# Patient Record
Sex: Female | Born: 1965 | Race: Asian | Hispanic: No | Marital: Married | Smoking: Never smoker
Health system: Southern US, Community
[De-identification: ages and names within clinical notes are randomized; demographics above are authoritative.]

---

## 2015-03-30 DIAGNOSIS — N816 Rectocele: Secondary | ICD-10-CM | POA: Insufficient documentation

## 2015-03-30 DIAGNOSIS — N814 Uterovaginal prolapse, unspecified: Secondary | ICD-10-CM | POA: Insufficient documentation

## 2015-03-30 DIAGNOSIS — D250 Submucous leiomyoma of uterus: Secondary | ICD-10-CM | POA: Insufficient documentation

## 2015-05-09 HISTORY — PX: ABDOMINAL HYSTERECTOMY: SHX81

## 2015-07-13 DIAGNOSIS — Z90710 Acquired absence of both cervix and uterus: Secondary | ICD-10-CM | POA: Insufficient documentation

## 2017-07-26 ENCOUNTER — Ambulatory Visit: Payer: Self-pay | Admitting: Family Medicine

## 2017-08-03 ENCOUNTER — Ambulatory Visit (INDEPENDENT_AMBULATORY_CARE_PROVIDER_SITE_OTHER): Payer: 59 | Admitting: Family Medicine

## 2017-08-03 ENCOUNTER — Encounter: Payer: Self-pay | Admitting: Family Medicine

## 2017-08-03 VITALS — BP 124/86 | HR 74 | Temp 97.7°F | Resp 14 | Ht 64.38 in | Wt 137.7 lb

## 2017-08-03 DIAGNOSIS — Z8041 Family history of malignant neoplasm of ovary: Secondary | ICD-10-CM

## 2017-08-03 DIAGNOSIS — Z1211 Encounter for screening for malignant neoplasm of colon: Secondary | ICD-10-CM

## 2017-08-03 DIAGNOSIS — Z8249 Family history of ischemic heart disease and other diseases of the circulatory system: Secondary | ICD-10-CM | POA: Diagnosis not present

## 2017-08-03 DIAGNOSIS — Z1231 Encounter for screening mammogram for malignant neoplasm of breast: Secondary | ICD-10-CM

## 2017-08-03 DIAGNOSIS — Z833 Family history of diabetes mellitus: Secondary | ICD-10-CM | POA: Diagnosis not present

## 2017-08-03 DIAGNOSIS — R635 Abnormal weight gain: Secondary | ICD-10-CM | POA: Diagnosis not present

## 2017-08-03 DIAGNOSIS — Z1239 Encounter for other screening for malignant neoplasm of breast: Secondary | ICD-10-CM

## 2017-08-03 NOTE — Progress Notes (Signed)
BP 124/86   Pulse 74   Temp 97.7 F (36.5 C) (Oral)   Resp 14   Ht 5' 4.38" (1.635 m)   Wt 137 lb 11.2 oz (62.5 kg)   SpO2 96%   BMI 23.36 kg/m    Subjective:    Patient ID: Christy Casey, female    DOB: [birth year], 52 y.o.   MRN: [id number]  HPI: Christy Casey is a 52 y.o. female  Chief Complaint  Patient presents with  . New Patient (Initial Visit)  . Establish Care    HPI Patient is here to establish care No issues with high blood pressure Hospitalized just for her hysterectomy; fibroids and bleeding and prolapse; no problems; ovaries remain, removed Fallopian tubes Mother died from ovarian cancer at age 65 years; she also had high blood pressure and depression Father has diabetes, now on insulin but pills at first; her sugar has been checked yearly with physicals; no dry mouth Brother had some coronary artery blockage and had a stent Cholesterol is normal too Good eater Sometimes exercises 10 pounds up over the last 3 years; no thyroid trouble  Depression screen PHQ 2/9 08/03/2017  Decreased Interest 0  Down, Depressed, Hopeless 0  PHQ - 2 Score 0    Relevant past medical, surgical, family and social history reviewed History reviewed. No pertinent past medical history. Past Surgical History:  Procedure Laterality Date  . ABDOMINAL HYSTERECTOMY  2017   Family History  Problem Relation Age of Onset  . Ovarian cancer Mother   . Hypertension Mother   . Depression Mother   . Diabetes Father   . CAD Brother   . Stroke Maternal Grandfather    Social History   Tobacco Use  . Smoking status: Never Smoker  . Smokeless tobacco: Never Used  Substance Use Topics  . Alcohol use: Not Currently  . Drug use: Not Currently    Interim medical history since last visit reviewed. Allergies and medications reviewed  Review of Systems  Constitutional: Negative for unexpected weight change.  Respiratory: Negative for shortness of breath.   Cardiovascular: Negative for  chest pain.  Hematological: Does not bruise/bleed easily.   Per HPI unless specifically indicated above     Objective:    BP 124/86   Pulse 74   Temp 97.7 F (36.5 C) (Oral)   Resp 14   Ht 5' 4.38" (1.635 m)   Wt 137 lb 11.2 oz (62.5 kg)   SpO2 96%   BMI 23.36 kg/m   Wt Readings from Last 3 Encounters:  08/03/17 137 lb 11.2 oz (62.5 kg)    Physical Exam  Constitutional: She appears well-developed and well-nourished.  HENT:  Mouth/Throat: Mucous membranes are normal.  Eyes: EOM are normal. No scleral icterus.  Cardiovascular: Normal rate and regular rhythm.  Pulmonary/Chest: Effort normal and breath sounds normal.  Psychiatric: She has a normal mood and affect. Her behavior is normal.    No results found for this or any previous visit.    Assessment & Plan:   Problem List Items Addressed This Visit    None    Visit Diagnoses    Weight gain    -  Primary   ten pounds over last 3 years, but nothing recent; willing to check TSH with physical labs when due, no signs of fluid overload; suspect caloric mismatch   Screen for colon cancer       Relevant Orders   Ambulatory referral to Gastroenterology   Family hx of  ovarian malignancy       Family history of coronary artery disease in brother       Family history of diabetes mellitus in father       Screening for breast cancer       Relevant Orders   MM SCREENING BREAST TOMO BILATERAL       Follow up plan: Return in about 1 year (around 08/04/2018) for complete physical (one year or sooner when due).  An after-visit summary was printed and given to the patient at [place].  Please see the patient instructions which may contain other information and recommendations beyond what is mentioned above in the assessment and plan.  No orders of the defined types were placed in this encounter.   Orders Placed This Encounter  Procedures  . MM SCREENING BREAST TOMO BILATERAL  . Ambulatory referral to Gastroenterology

## 2017-08-03 NOTE — Patient Instructions (Addendum)
Consider getting the new shingles vaccine called Shingrix; that is available for individuals 54 years of age and older, and is recommended even if you have had shingles in the past and/or already received the old shingles vaccine (Zostavax); it is a two-part series, and is available at many local pharmacies  Return for your yearly physical when due  Please do have your mammogram in December (on or after the 2nd), you have the number to call to schedule  DASH Eating Plan DASH stands for "Dietary Approaches to Stop Hypertension." The DASH eating plan is a healthy eating plan that has been shown to reduce high blood pressure (hypertension). It may also reduce your risk for type 2 diabetes, heart disease, and stroke. The DASH eating plan may also help with weight loss. What are tips for following this plan? General guidelines  Avoid eating more than 2,300 mg (milligrams) of salt (sodium) a day. If you have hypertension, you may need to reduce your sodium intake to 1,500 mg a day.  Limit alcohol intake to no more than 1 drink a day for nonpregnant women and 2 drinks a day for men. One drink equals 12 oz of beer, 5 oz of wine, or 1 oz of hard liquor.  Work with your health care provider to maintain a healthy body weight or to lose weight. Ask what an ideal weight is for you.  Get at least 30 minutes of exercise that causes your heart to beat faster (aerobic exercise) most days of the week. Activities may include walking, swimming, or biking.  Work with your health care provider or diet and nutrition specialist (dietitian) to adjust your eating plan to your individual calorie needs. Reading food labels  Check food labels for the amount of sodium per serving. Choose foods with less than 5 percent of the Daily Value of sodium. Generally, foods with less than 300 mg of sodium per serving fit into this eating plan.  To find whole grains, look for the word "whole" as the first word in the ingredient  list. Shopping  Buy products labeled as "low-sodium" or "no salt added."  Buy fresh foods. Avoid canned foods and premade or frozen meals. Cooking  Avoid adding salt when cooking. Use salt-free seasonings or herbs instead of table salt or sea salt. Check with your health care provider or pharmacist before using salt substitutes.  Do not fry foods. Cook foods using healthy methods such as baking, boiling, grilling, and broiling instead.  Cook with heart-healthy oils, such as olive, canola, soybean, or sunflower oil. Meal planning   Eat a balanced diet that includes: ? 5 or more servings of fruits and vegetables each day. At each meal, try to fill half of your plate with fruits and vegetables. ? Up to 6-8 servings of whole grains each day. ? Less than 6 oz of lean meat, poultry, or fish each day. A 3-oz serving of meat is about the same size as a deck of cards. One egg equals 1 oz. ? 2 servings of low-fat dairy each day. ? A serving of nuts, seeds, or beans 5 times each week. ? Heart-healthy fats. Healthy fats called Omega-3 fatty acids are found in foods such as flaxseeds and coldwater fish, like sardines, salmon, and mackerel.  Limit how much you eat of the following: ? Canned or prepackaged foods. ? Food that is high in trans fat, such as fried foods. ? Food that is high in saturated fat, such as fatty meat. ? Sweets, desserts,  sugary drinks, and other foods with added sugar. ? Full-fat dairy products.  Do not salt foods before eating.  Try to eat at least 2 vegetarian meals each week.  Eat more home-cooked food and less restaurant, buffet, and fast food.  When eating at a restaurant, ask that your food be prepared with less salt or no salt, if possible. What foods are recommended? The items listed may not be a complete list. Talk with your dietitian about what dietary choices are best for you. Grains Whole-grain or whole-wheat bread. Whole-grain or whole-wheat pasta. Brown  rice. [place]. Bulgur. Whole-grain and low-sodium cereals. Pita bread. Low-fat, low-sodium crackers. Whole-wheat flour tortillas. Vegetables Fresh or frozen vegetables (raw, steamed, roasted, or grilled). Low-sodium or reduced-sodium tomato and vegetable juice. Low-sodium or reduced-sodium tomato sauce and tomato paste. Low-sodium or reduced-sodium canned vegetables. Fruits All fresh, dried, or frozen fruit. Canned fruit in natural juice (without added sugar). Meat and other protein foods Skinless chicken or [place]. Ground chicken or [place]. Pork with fat trimmed off. Fish and seafood. Egg whites. Dried beans, peas, or lentils. Unsalted nuts, nut butters, and seeds. Unsalted canned beans. Lean cuts of beef with fat trimmed off. Low-sodium, lean deli meat. Dairy Low-fat (1%) or fat-free (skim) milk. Fat-free, low-fat, or reduced-fat cheeses. Nonfat, low-sodium ricotta or cottage cheese. Low-fat or nonfat yogurt. Low-fat, low-sodium cheese. Fats and oils Soft margarine without trans fats. Vegetable oil. Low-fat, reduced-fat, or light mayonnaise and salad dressings (reduced-sodium). Canola, safflower, olive, soybean, and sunflower oils. Avocado. Seasoning and other foods Herbs. Spices. Seasoning mixes without salt. Unsalted popcorn and pretzels. Fat-free sweets. What foods are not recommended? The items listed may not be a complete list. Talk with your dietitian about what dietary choices are best for you. Grains Baked goods made with fat, such as croissants, muffins, or some breads. Dry pasta or rice meal packs. Vegetables Creamed or fried vegetables. Vegetables in a cheese sauce. Regular canned vegetables (not low-sodium or reduced-sodium). Regular canned tomato sauce and paste (not low-sodium or reduced-sodium). Regular tomato and vegetable juice (not low-sodium or reduced-sodium). Angie Fava. Olives. Fruits Canned fruit in a light or heavy syrup. Fried fruit. Fruit in cream or butter  sauce. Meat and other protein foods Fatty cuts of meat. Ribs. Fried meat. Berniece Salines. Sausage. Bologna and other processed lunch meats. Salami. [place]back. Hotdogs. Bratwurst. Salted nuts and seeds. Canned beans with added salt. Canned or smoked fish. Whole eggs or egg yolks. Chicken or [place] with skin. Dairy Whole or 2% milk, cream, and half-and-half. Whole or full-fat cream cheese. Whole-fat or sweetened yogurt. Full-fat cheese. Nondairy creamers. Whipped toppings. Processed cheese and cheese spreads. Fats and oils Butter. Stick margarine. Lard. Shortening. Ghee. Bacon fat. Tropical oils, such as coconut, palm kernel, or palm oil. Seasoning and other foods Salted popcorn and pretzels. Onion salt, garlic salt, seasoned salt, table salt, and sea salt. Worcestershire sauce. Tartar sauce. Barbecue sauce. Teriyaki sauce. Soy sauce, including reduced-sodium. Steak sauce. Canned and packaged gravies. Fish sauce. Oyster sauce. Cocktail sauce. Horseradish that you find on the shelf. Ketchup. Mustard. Meat flavorings and tenderizers. Bouillon cubes. Hot sauce and Tabasco sauce. Premade or packaged marinades. Premade or packaged taco seasonings. Relishes. Regular salad dressings. Where to find more information:  National Heart, Lung, and [place]: https://wilson-eaton.com/  American Heart Association: www.heart.org Summary  The DASH eating plan is a healthy eating plan that has been shown to reduce high blood pressure (hypertension). It may also reduce your risk for type 2 diabetes, heart disease,  and stroke.  With the DASH eating plan, you should limit salt (sodium) intake to 2,300 mg a day. If you have hypertension, you may need to reduce your sodium intake to 1,500 mg a day.  When on the DASH eating plan, aim to eat more fresh fruits and vegetables, whole grains, lean proteins, low-fat dairy, and heart-healthy fats.  Work with your health care provider or diet and nutrition specialist (dietitian) to adjust  your eating plan to your individual calorie needs. This information is not intended to replace advice given to you by your health care provider. Make sure you discuss any questions you have with your health care provider. Document Released: 04/13/2011 Document Revised: 04/17/2016 Document Reviewed: 04/17/2016 Elsevier Interactive Patient Education  2018 [place] Heart-healthy meal planning includes:  Limiting unhealthy fats.  Increasing healthy fats.  Making other small dietary changes.  You may need to talk with your doctor or a diet specialist (dietitian) to create an eating plan that is right for you. What types of fat should I choose?  Choose healthy fats. These include olive oil and canola oil, flaxseeds, walnuts, almonds, and seeds.  Eat more omega-3 fats. These include salmon, mackerel, sardines, tuna, flaxseed oil, and ground flaxseeds. Try to eat fish at least twice each week.  Limit saturated fats. ? Saturated fats are often found in animal products, such as meats, butter, and cream. ? Plant sources of saturated fats include palm oil, palm kernel oil, and coconut oil.  Avoid foods with partially hydrogenated oils in them. These include stick margarine, some tub margarines, cookies, crackers, and other baked goods. These contain trans fats. What general guidelines do I need to follow?  Check food labels carefully. Identify foods with trans fats or high amounts of saturated fat.  Fill one half of your plate with vegetables and green salads. Eat 4-5 servings of vegetables per day. A serving of vegetables is: ? 1 cup of raw leafy vegetables. ?  cup of raw or cooked cut-up vegetables. ?  cup of vegetable juice.  Fill one fourth of your plate with whole grains. Look for the word "whole" as the first word in the ingredient list.  Fill one fourth of your plate with lean protein foods.  Eat 4-5 servings of fruit per day. A serving of fruit  is: ? One medium whole fruit. ?  cup of dried fruit. ?  cup of fresh, frozen, or canned fruit. ?  cup of 100% fruit juice.  Eat more foods that contain soluble fiber. These include apples, broccoli, carrots, beans, peas, and barley. Try to get 20-30 g of fiber per day.  Eat more home-cooked food. Eat less restaurant, buffet, and fast food.  Limit or avoid alcohol.  Limit foods high in starch and sugar.  Avoid fried foods.  Avoid frying your food. Try baking, boiling, grilling, or broiling it instead. You can also reduce fat by: ? Removing the skin from poultry. ? Removing all visible fats from meats. ? Skimming the fat off of stews, soups, and gravies before serving them. ? Steaming vegetables in water or broth.  Lose weight if you are overweight.  Eat 4-5 servings of nuts, legumes, and seeds per week: ? One serving of dried beans or legumes equals  cup after being cooked. ? One serving of nuts equals 1 ounces. ? One serving of seeds equals  ounce or one tablespoon.  You may need to keep track of how much salt or sodium  you eat. This is especially true if you have high blood pressure. Talk with your doctor or dietitian to get more information. What foods can I eat? Grains Breads, including [place], white, pita, wheat, raisin, rye, oatmeal, and [place]. Tortillas that are neither fried nor made with lard or trans fat. Low-fat rolls, including hotdog and hamburger buns and English muffins. Biscuits. Muffins. Waffles. Pancakes. Light popcorn. Whole-grain cereals. Flatbread. Melba toast. Pretzels. Breadsticks. Rusks. Low-fat snacks. Low-fat crackers, including oyster, saltine, matzo, graham, animal, and rye. Rice and pasta, including brown rice and pastas that are made with whole wheat. Vegetables All vegetables. Fruits All fruits, but limit coconut. Meats and Other Protein Sources Lean, well-trimmed beef, veal, pork, and lamb. Chicken and [place] without skin. All fish and  shellfish. Wild duck, rabbit, pheasant, and venison. Egg whites or low-cholesterol egg substitutes. Dried beans, peas, lentils, and tofu. Seeds and most nuts. Dairy Low-fat or nonfat cheeses, including ricotta, string, and mozzarella. Skim or 1% milk that is liquid, powdered, or evaporated. Buttermilk that is made with low-fat milk. Nonfat or low-fat yogurt. Beverages Mineral water. Diet carbonated beverages. Sweets and Desserts Sherbets and fruit ices. Honey, jam, marmalade, jelly, and syrups. Meringues and gelatins. Pure sugar candy, such as hard candy, jelly beans, gumdrops, mints, marshmallows, and small amounts of dark chocolate. [employer] Eat all sweets and desserts in moderation. Fats and Oils Nonhydrogenated (trans-free) margarines. Vegetable oils, including soybean, sesame, sunflower, olive, peanut, safflower, corn, canola, and cottonseed. Salad dressings or mayonnaise made with a vegetable oil. Limit added fats and oils that you use for cooking, baking, salads, and as spreads. Other Cocoa powder. Coffee and tea. All seasonings and condiments. The items listed above may not be a complete list of recommended foods or beverages. Contact your dietitian for more options. What foods are not recommended? Grains Breads that are made with saturated or trans fats, oils, or whole milk. Croissants. Butter rolls. Cheese breads. Sweet rolls. Donuts. Buttered popcorn. Chow mein noodles. High-fat crackers, such as cheese or butter crackers. Meats and Other Protein Sources Fatty meats, such as hotdogs, short ribs, sausage, spareribs, bacon, rib eye roast or steak, and mutton. High-fat deli meats, such as salami and bologna. Caviar. Domestic duck and goose. Organ meats, such as kidney, liver, sweetbreads, and heart. Dairy Cream, sour cream, cream cheese, and creamed cottage cheese. Whole-milk cheeses, including blue (bleu), Monterey Jack, [place], [place], American, [place], Swiss, cheddar, [place],  and [place]. Whole or 2% milk that is liquid, evaporated, or condensed. Whole buttermilk. Cream sauce or high-fat cheese sauce. Yogurt that is made from whole milk. Beverages Regular sodas and juice drinks with added sugar. Sweets and Desserts Frosting. Pudding. Cookies. Cakes other than angel food cake. Candy that has milk chocolate or white chocolate, hydrogenated fat, butter, coconut, or unknown ingredients. Buttered syrups. Full-fat ice cream or ice cream drinks. Fats and Oils Gravy that has suet, meat fat, or shortening. Cocoa butter, hydrogenated oils, palm oil, coconut oil, palm kernel oil. These can often be found in baked products, candy, fried foods, nondairy creamers, and whipped toppings. Solid fats and shortenings, including bacon fat, salt pork, lard, and butter. Nondairy cream substitutes, such as coffee creamers and sour cream substitutes. Salad dressings that are made of unknown oils, cheese, or sour cream. The items listed above may not be a complete list of foods and beverages to avoid. Contact your dietitian for more information. This information is not intended to replace advice given to you by your health care  provider. Make sure you discuss any questions you have with your health care provider. Document Released: 10/24/2011 Document Revised: 09/30/2015 Document Reviewed: 10/16/2013 Elsevier Interactive Patient Education  [employer].

## 2017-08-04 ENCOUNTER — Other Ambulatory Visit: Payer: Self-pay

## 2017-08-04 DIAGNOSIS — Z1211 Encounter for screening for malignant neoplasm of colon: Secondary | ICD-10-CM

## 2017-08-23 ENCOUNTER — Encounter: Payer: Self-pay | Admitting: *Deleted

## 2017-08-24 ENCOUNTER — Encounter
Admission: RE | Disposition: A | Discharge status: Home / Self Care | Payer: Self-pay | Source: Ambulatory Visit | Attending: Gastroenterology

## 2017-08-24 ENCOUNTER — Ambulatory Visit: Payer: 59 | Admitting: Anesthesiology

## 2017-08-24 ENCOUNTER — Ambulatory Visit
Admission: RE | Admit: 2017-08-24 | Discharge: 2017-08-24 | Disposition: A | Discharge status: Home / Self Care | Payer: 59 | Source: Ambulatory Visit | Attending: Gastroenterology | Admitting: Gastroenterology

## 2017-08-24 ENCOUNTER — Encounter: Payer: Self-pay | Admitting: Anesthesiology

## 2017-08-24 DIAGNOSIS — K635 Polyp of colon: Secondary | ICD-10-CM | POA: Insufficient documentation

## 2017-08-24 DIAGNOSIS — Z1211 Encounter for screening for malignant neoplasm of colon: Secondary | ICD-10-CM | POA: Diagnosis not present

## 2017-08-24 DIAGNOSIS — Z880 Allergy status to penicillin: Secondary | ICD-10-CM | POA: Diagnosis not present

## 2017-08-24 DIAGNOSIS — Z8249 Family history of ischemic heart disease and other diseases of the circulatory system: Secondary | ICD-10-CM | POA: Insufficient documentation

## 2017-08-24 DIAGNOSIS — D124 Benign neoplasm of descending colon: Secondary | ICD-10-CM

## 2017-08-24 HISTORY — PX: COLONOSCOPY WITH PROPOFOL: SHX5780

## 2017-08-24 SURGERY — COLONOSCOPY WITH PROPOFOL
Anesthesia: General

## 2017-08-24 MED ORDER — LIDOCAINE HCL (CARDIAC) PF 100 MG/5ML IV SOSY
PREFILLED_SYRINGE | INTRAVENOUS | Status: DC | PRN
  Administered 2017-08-24: 50 mg via INTRAVENOUS

## 2017-08-24 MED ORDER — LIDOCAINE HCL (PF) 2 % IJ SOLN
INTRAMUSCULAR | Status: AC
  Filled 2017-08-24: qty 10

## 2017-08-24 MED ORDER — PROPOFOL 10 MG/ML IV BOLUS
INTRAVENOUS | Status: DC | PRN
  Administered 2017-08-24: 30 mg via INTRAVENOUS
  Administered 2017-08-24: 70 mg via INTRAVENOUS
  Administered 2017-08-24 (×4): 30 mg via INTRAVENOUS

## 2017-08-24 MED ORDER — SODIUM CHLORIDE 0.9 % IV SOLN
INTRAVENOUS | Status: DC
  Administered 2017-08-24: 1000 mL via INTRAVENOUS

## 2017-08-24 MED ORDER — PROPOFOL 10 MG/ML IV BOLUS
INTRAVENOUS | Status: AC
  Filled 2017-08-24: qty 20

## 2017-08-24 NOTE — Anesthesia Post-op Follow-up Note (Signed)
Anesthesia QCDR form completed.        

## 2017-08-24 NOTE — Anesthesia Preprocedure Evaluation (Signed)
Anesthesia Evaluation  Patient identified by MRN, date of birth, ID band Patient awake    Reviewed: Allergy & Precautions, NPO status , Patient's Chart, lab work & pertinent test results, reviewed documented beta blocker date and time   Airway Mallampati: II  TM Distance: >3 FB     Dental  (+) Chipped   Pulmonary           Cardiovascular      Neuro/Psych    GI/Hepatic   Endo/Other    Renal/GU      Musculoskeletal   Abdominal   Peds  Hematology   Anesthesia Other Findings   Reproductive/Obstetrics                             Anesthesia Physical Anesthesia Plan  ASA: II  Anesthesia Plan: General   Post-op Pain Management:    Induction: Intravenous  PONV Risk Score and Plan:   Airway Management Planned:   Additional Equipment:   Intra-op Plan:   Post-operative Plan:   Informed Consent: I have reviewed the patients History and Physical, chart, labs and discussed the procedure including the risks, benefits and alternatives for the proposed anesthesia with the patient or authorized representative who has indicated his/her understanding and acceptance.     Plan Discussed with: CRNA  Anesthesia Plan Comments:         Anesthesia Quick Evaluation  

## 2017-08-24 NOTE — H&P (Signed)
Cephas Darby, MD [address]  [place]  [place], [place] [address]  Main: [phone]  Fax: [phone] Pager: [phone]  Primary Care Physician:  Arnetha Courser, MD Primary Gastroenterologist:  Dr. Cephas Darby  Pre-Procedure History & Physical: HPI:  Christy Casey is a 52 y.o. female is here for an colonoscopy.   History reviewed. No pertinent past medical history.  Past Surgical History:  Procedure Laterality Date  . ABDOMINAL HYSTERECTOMY  2017    Prior to Admission medications   Not on File    Allergies as of 08/05/2017 - Review Complete 08/03/2017  Allergen Reaction Noted  . Penicillins Other (See Comments) and Shortness Of Breath 08/26/2012    Family History  Problem Relation Age of Onset  . Ovarian cancer Mother   . Hypertension Mother   . Depression Mother   . Diabetes Father   . CAD Brother   . Stroke Maternal Grandfather     Social History   Socioeconomic History  . Marital status: Married    Spouse name: Not on file  . Number of children: Not on file  . Years of education: Not on file  . Highest education level: Not on file  Occupational History  . Not on file  Social Needs  . Financial resource strain: Not on file  . Food insecurity:    Worry: Not on file    Inability: Not on file  . Transportation needs:    Medical: Not on file    Non-medical: Not on file  Tobacco Use  . Smoking status: Never Smoker  . Smokeless tobacco: Never Used  Substance and Sexual Activity  . Alcohol use: Not Currently  . Drug use: Not Currently  . Sexual activity: Yes  Lifestyle  . Physical activity:    Days per week: Not on file    Minutes per session: Not on file  . Stress: Not on file  Relationships  . Social connections:    Talks on phone: Not on file    Gets together: Not on file    Attends religious service: Not on file    Active member of club or organization: Not on file    Attends meetings of clubs or organizations: Not on file    Relationship status: Not on file  . Intimate partner violence:    Fear of current or ex partner: Not on file    Emotionally abused: Not on file    Physically abused: Not on file    Forced sexual activity: Not on file  Other Topics Concern  . Not on file  Social History Narrative  . Not on file    Review of Systems: See HPI, otherwise negative ROS  Physical Exam: BP 119/75   Pulse 74   Temp 98.9 F (37.2 C) (Tympanic)   Resp 20   Ht 5\' 4"  (1.626 m)   Wt 133 lb (60.3 kg)   SpO2 100%   BMI 22.83 kg/m  General:   Alert,  pleasant and cooperative in NAD Head:  Normocephalic and atraumatic. Neck:  Supple; no masses or thyromegaly. Lungs:  Clear throughout to auscultation.    Heart:  Regular rate and rhythm. Abdomen:  Soft, nontender and nondistended. Normal bowel sounds, without guarding, and without rebound.   Neurologic:  Alert and  oriented x4;  grossly normal neurologically.  Impression/Plan: Christy Casey is here for an colonoscopy to be performed for colon cancer screening  Risks, benefits, limitations, and alternatives regarding  colonoscopy have  been reviewed with the patient.  Questions have been answered.  All parties agreeable.   Sherri Sear, MD  08/24/2017, 9:43 AM

## 2017-08-24 NOTE — Transfer of Care (Signed)
Immediate Anesthesia Transfer of Care Note  Patient: Christy Casey  Procedure(s) Performed: COLONOSCOPY WITH PROPOFOL (N/A )  Patient Location: PACU and Endoscopy Unit  Anesthesia Type:General  Level of Consciousness: awake  Airway & Oxygen Therapy: Patient Spontanous Breathing  Post-op Assessment: Report given to RN  Post vital signs: stable  Last Vitals:  Vitals Value Taken Time  BP    Temp    Pulse    Resp    SpO2      Last Pain:  Vitals:   08/24/17 0847  TempSrc: Tympanic         Complications: No apparent anesthesia complications

## 2017-08-24 NOTE — Anesthesia Postprocedure Evaluation (Signed)
Anesthesia Post Note  Patient: Christy Casey  Procedure(s) Performed: COLONOSCOPY WITH PROPOFOL (N/A )  Patient location during evaluation: Endoscopy Anesthesia Type: General Level of consciousness: awake and alert Pain management: pain level controlled Vital Signs Assessment: post-procedure vital signs reviewed and stable Respiratory status: spontaneous breathing, nonlabored ventilation, respiratory function stable and patient connected to nasal cannula oxygen Cardiovascular status: blood pressure returned to baseline and stable Postop Assessment: no apparent nausea or vomiting Anesthetic complications: no     Last Vitals:  Vitals:   08/24/17 1114 08/24/17 1124  BP: 97/65 101/67  Pulse: 67 63  Resp: 14 11  Temp:    SpO2: 100% 100%    Last Pain:  Vitals:   08/24/17 1124  TempSrc:   PainSc: 0-No pain                 Antonela Freiman S

## 2017-08-24 NOTE — Op Note (Signed)
[facility] Gastroenterology Patient Name: Christy Casey Procedure Date: 08/24/2017 10:29 AM MRN: [id number] Account #: [id number] Date of Birth: 12-12-1965 Admit Type: Outpatient Age: 52 Room: [facility] ENDO ROOM 2 Gender: Female Note Status: Finalized Procedure:            Colonoscopy Indications:          Screening for colorectal malignant neoplasm, This is                        the patient's first colonoscopy Providers:            Lin Landsman MD, MD Referring MD:         Arnetha Courser (Referring MD) Medicines:            Monitored Anesthesia Care Complications:        No immediate complications. Estimated blood loss:                        Minimal. Procedure:            Pre-Anesthesia Assessment:                       - Prior to the procedure, a History and Physical was                        performed, and patient medications and allergies were                        reviewed. The patient is competent. The risks and                        benefits of the procedure and the sedation options and                        risks were discussed with the patient. All questions                        were answered and informed consent was obtained.                        Patient identification and proposed procedure were                        verified by the physician, the nurse, the                        anesthesiologist, the anesthetist and the technician in                        the pre-procedure area in the procedure room in the                        endoscopy suite. Mental Status Examination: alert and                        oriented. Airway Examination: normal oropharyngeal                        airway and neck mobility. Respiratory Examination:  clear to auscultation. CV Examination: normal.                        Prophylactic Antibiotics: The patient does not require                        prophylactic antibiotics. Prior  Anticoagulants: The                        patient has taken no previous anticoagulant or                        antiplatelet agents. ASA Grade Assessment: II - A                        patient with mild systemic disease. After reviewing the                        risks and benefits, the patient was deemed in                        satisfactory condition to undergo the procedure. The                        anesthesia plan was to use monitored anesthesia care                        (MAC). Immediately prior to administration of                        medications, the patient was re-assessed for adequacy                        to receive sedatives. The heart rate, respiratory rate,                        oxygen saturations, blood pressure, adequacy of                        pulmonary ventilation, and response to care were                        monitored throughout the procedure. The physical status                        of the patient was re-assessed after the procedure.                       After obtaining informed consent, the colonoscope was                        passed under direct vision. Throughout the procedure,                        the patient's blood pressure, pulse, and oxygen                        saturations were monitored continuously. The                        Colonoscope  was introduced through the anus and                        advanced to the the cecum, identified by appendiceal                        orifice and ileocecal valve. The colonoscopy was                        performed without difficulty. The patient tolerated the                        procedure well. The quality of the bowel preparation                        was evaluated using the BBPS [facility] Bowel Preparation                        Scale) with scores of: Right Colon = 3, Transverse                        Colon = 3 and Left Colon = 3 (entire mucosa seen well                        with no residual  staining, small fragments of stool or                        opaque liquid). The total BBPS score equals 9. Findings:      The perianal and digital rectal examinations were normal. Pertinent       negatives include normal sphincter tone and no palpable rectal lesions.      A diminutive polyp was found in the descending colon. The polyp was       sessile. The polyp was removed with a cold biopsy forceps. Resection and       retrieval were complete. For hemostasis, one hemostatic clip was       successfully placed (MR conditional). There was no bleeding at the end       of the procedure.      The retroflexed view of the distal rectum and anal verge was normal and       showed no anal or rectal abnormalities.      The exam was otherwise without abnormality. Impression:           - One diminutive polyp in the descending colon, removed                        with a cold biopsy forceps. Resected and retrieved.                       - The distal rectum and anal verge are normal on                        retroflexion view.                       - The examination was otherwise normal. Recommendation:       - Discharge patient to home.                       -  Resume previous diet today.                       - Continue present medications.                       - Await pathology results.                       - Repeat colonoscopy in 5-10 years for surveillance                        based on pathology results. Procedure Code(s):    --- Professional ---                       [phone], Colonoscopy, flexible; with control of bleeding,                        any method Diagnosis Code(s):    --- Professional ---                       Z12.11, Encounter for screening for malignant neoplasm                        of colon                       D12.4, Benign neoplasm of descending colon CPT copyright 2017 [employer]. All rights reserved. The codes documented in this report are preliminary  and upon coder review may  be revised to meet current compliance requirements. Dr. Ulyess Mort Lin Landsman MD, MD 08/24/2017 10:52:47 AM This report has been signed electronically. Number of Addenda: 0 Note Initiated On: 08/24/2017 10:29 AM Scope Withdrawal Time: 0 hours 12 minutes 36 seconds  Total Procedure Duration: 0 hours 16 minutes 58 seconds       [facility]

## 2017-08-27 ENCOUNTER — Encounter: Payer: Self-pay | Admitting: Gastroenterology

## 2017-08-27 LAB — SURGICAL PATHOLOGY

## 2017-10-16 ENCOUNTER — Encounter: Payer: Self-pay | Admitting: Family Medicine

## 2017-10-16 ENCOUNTER — Ambulatory Visit (INDEPENDENT_AMBULATORY_CARE_PROVIDER_SITE_OTHER): Payer: 59 | Admitting: Family Medicine

## 2017-10-16 DIAGNOSIS — Z0000 Encounter for general adult medical examination without abnormal findings: Secondary | ICD-10-CM | POA: Insufficient documentation

## 2017-10-16 LAB — COMPLETE METABOLIC PANEL WITH GFR
AG Ratio: 1.5 (calc) (ref 1.0–2.5)
ALKALINE PHOSPHATASE (APISO): 66 U/L (ref 33–130)
ALT: 13 U/L (ref 6–29)
AST: 19 U/L (ref 10–35)
Albumin: 4.8 g/dL (ref 3.6–5.1)
BUN: 13 mg/dL (ref 7–25)
CALCIUM: 9.5 mg/dL (ref 8.6–10.4)
CO2: 25 mmol/L (ref 20–32)
CREATININE: 0.75 mg/dL (ref 0.50–1.05)
Chloride: 103 mmol/L (ref 98–110)
GFR, EST NON AFRICAN AMERICAN: 91 mL/min/{1.73_m2} (ref >=60)
GFR, Est African American: 105 mL/min/{1.73_m2} (ref >=60)
GLOBULIN: 3.3 g/dL (ref 1.9–3.7)
GLUCOSE: 81 mg/dL (ref 65–99)
Potassium: 4 mmol/L (ref 3.5–5.3)
SODIUM: 138 mmol/L (ref 135–146)
Total Bilirubin: 0.6 mg/dL (ref 0.2–1.2)
Total Protein: 8.1 g/dL (ref 6.1–8.1)

## 2017-10-16 LAB — LIPID PANEL
CHOLESTEROL: 272 mg/dL — AB (ref <200)
HDL: 69 mg/dL (ref >50)
LDL CHOLESTEROL (CALC): 166 mg/dL — AB
NON-HDL CHOLESTEROL (CALC): 203 mg/dL — AB (ref <130)
TRIGLYCERIDES: 206 mg/dL — AB (ref <150)
Total CHOL/HDL Ratio: 3.9 (calc) (ref <5.0)

## 2017-10-16 LAB — CBC WITH DIFFERENTIAL/PLATELET
Basophils Absolute: 40 cells/uL (ref 0–200)
Basophils Relative: 0.9 %
EOS PCT: 1.6 %
Eosinophils Absolute: 70 cells/uL (ref 15–500)
HEMATOCRIT: 44.2 % (ref 35.0–45.0)
HEMOGLOBIN: 15.2 g/dL (ref 11.7–15.5)
LYMPHS ABS: 1069 {cells}/uL (ref 850–3900)
MCH: 30.5 pg (ref 27.0–33.0)
MCHC: 34.4 g/dL (ref 32.0–36.0)
MCV: 88.6 fL (ref 80.0–100.0)
MONOS PCT: 6.2 %
MPV: 11.3 fL (ref 7.5–12.5)
NEUTROS ABS: 2948 {cells}/uL (ref 1500–7800)
NEUTROS PCT: 67 %
Platelets: 158 10*3/uL (ref 140–400)
RBC: 4.99 10*6/uL (ref 3.80–5.10)
RDW: 12.2 % (ref 11.0–15.0)
Total Lymphocyte: 24.3 %
WBC mixed population: 273 cells/uL (ref 200–950)
WBC: 4.4 10*3/uL (ref 3.8–10.8)

## 2017-10-16 LAB — TSH: TSH: 3.51 mIU/L

## 2017-10-16 NOTE — Progress Notes (Signed)
Patient ID: Christy Casey, female   DOB: 04/04/1966, 52 y.o.   MRN: [id number]   Subjective:   Christy Casey is a 52 y.o. female here for a complete physical exam  Interim issues since last visit: no medical excitement  USPSTF grade A and B recommendations Depression:  Depression screen [facility] 2/9 10/16/2017 08/03/2017  Decreased Interest 0 0  Down, Depressed, Hopeless 0 0  PHQ - 2 Score 0 0   Hypertension: BP Readings from Last 3 Encounters:  10/16/17 114/82  08/24/17 101/67  08/03/17 124/86   Obesity: Wt Readings from Last 3 Encounters:  10/16/17 136 lb 12.8 oz (62.1 kg)  08/24/17 133 lb (60.3 kg)  08/03/17 137 lb 11.2 oz (62.5 kg)   BMI Readings from Last 3 Encounters:  10/16/17 23.03 kg/m  08/24/17 22.83 kg/m  08/03/17 23.36 kg/m     Skin cancer: nothing worrisome Lung cancer:  nonsmoker Breast cancer: mammo; no lumps or bumps Colorectal cancer: UTD Cervical cancer screening: s/p hyst BRCA gene screening: family hx of breast and/or ovarian cancer and/or metastatic prostate cancer? Mother had ovarian cancer; no one else in family HIV, hep B, hep C: no exposures, declined STD testing and prevention (chl/gon/syphilis): not interested Intimate partner violence: no abuse Contraception: n/a Osteoporosis: n/a Fall prevention/vitamin D: discussed Immunizations: new shingles vaccine discussed; patient will wait a little bit Diet: good eater Exercise: nothing regularly, from time to time Alcohol: no Tobacco use: nonsmoker AAA: n/a Aspirin: n/a Glucose: check today; fasting No results found for: GLUCOSE, GLUCAP Lipids: check fasting lipids today No results found for: CHOL No results found for: HDL No results found for: LDLCALC No results found for: TRIG No results found for: CHOLHDL No results found for: LDLDIRECT   History reviewed. No pertinent past medical history.   Past Surgical History:  Procedure Laterality Date  . ABDOMINAL HYSTERECTOMY  2017  .  COLONOSCOPY WITH PROPOFOL N/A 08/24/2017   Procedure: COLONOSCOPY WITH PROPOFOL;  Surgeon: Lin Landsman, MD;  Location: [facility] ENDOSCOPY;  Service: Endoscopy;  Laterality: N/A;   Family History  Problem Relation Age of Onset  . Ovarian cancer Mother   . Hypertension Mother   . Depression Mother   . Diabetes Father   . CAD Brother   . Stroke Maternal Grandfather    Social History   Tobacco Use  . Smoking status: Never Smoker  . Smokeless tobacco: Never Used  Substance Use Topics  . Alcohol use: Not Currently  . Drug use: Not Currently   Review of Systems  Constitutional: Negative for unexpected weight change.  HENT: Negative for hearing loss.   Eyes: Negative for visual disturbance (wears glasses).  Respiratory: Negative for wheezing.   Cardiovascular: Negative for chest pain.  Gastrointestinal: Negative for blood in stool.  Genitourinary: Negative for hematuria.  Musculoskeletal: Negative for arthralgias.  Allergic/Immunologic: Negative for food allergies.  Hematological: Does not bruise/bleed easily.    Objective:   Vitals:   10/16/17 0852  BP: 114/82  Pulse: 79  Resp: 12  Temp: 97.7 F (36.5 C)  TempSrc: Oral  SpO2: 97%  Weight: 136 lb 12.8 oz (62.1 kg)  Height: 5' 4.63" (1.642 m)   Body mass index is 23.03 kg/m. Wt Readings from Last 3 Encounters:  10/16/17 136 lb 12.8 oz (62.1 kg)  08/24/17 133 lb (60.3 kg)  08/03/17 137 lb 11.2 oz (62.5 kg)   Physical Exam  Constitutional: She appears well-developed and well-nourished.  HENT:  Head: Normocephalic and atraumatic.  Right Ear:  Hearing, tympanic membrane, external ear and ear canal normal.  Left Ear: Hearing, tympanic membrane, external ear and ear canal normal.  Eyes: Conjunctivae and EOM are normal. Right eye exhibits no hordeolum. Left eye exhibits no hordeolum. No scleral icterus.  Neck: Carotid bruit is not present. No thyromegaly present.  Cardiovascular: Normal rate, regular rhythm, S1  normal, S2 normal and normal heart sounds.  No extrasystoles are present.  Pulmonary/Chest: Effort normal and breath sounds normal. No respiratory distress. Right breast exhibits no inverted nipple, no mass, no nipple discharge, no skin change and no tenderness. Left breast exhibits no inverted nipple, no mass, no nipple discharge, no skin change and no tenderness. Breasts are symmetrical.  Abdominal: Soft. Normal appearance and bowel sounds are normal. She exhibits no distension, no abdominal bruit, no pulsatile midline mass and no mass. There is no hepatosplenomegaly. There is no tenderness. No hernia.  Musculoskeletal: Normal range of motion. She exhibits no edema.  Lymphadenopathy:       Head (right side): No submandibular adenopathy present.       Head (left side): No submandibular adenopathy present.    She has no cervical adenopathy.    She has no axillary adenopathy.  Neurological: She is alert. She displays no tremor. No cranial nerve deficit. She exhibits normal muscle tone. Gait normal.  Reflex Scores:      Patellar reflexes are 2+ on the right side and 2+ on the left side. Skin: Skin is warm and dry. No bruising and no ecchymosis noted. No cyanosis. No pallor.  Psychiatric: Her speech is normal and behavior is normal. Thought content normal. Her mood appears not anxious. She does not exhibit a depressed mood.    Assessment/Plan:   Problem List Items Addressed This Visit      Other   Preventative health care    USPSTF grade A and B recommendations reviewed with patient; age-appropriate recommendations, preventive care, screening tests, etc discussed and encouraged; healthy living encouraged; see AVS for patient education given to patient       Relevant Orders   CBC with Differential/Platelet   COMPLETE METABOLIC PANEL WITH GFR   Lipid panel   TSH       No orders of the defined types were placed in this encounter.  Orders Placed This Encounter  Procedures  . CBC with  Differential/Platelet  . COMPLETE METABOLIC PANEL WITH GFR  . Lipid panel  . TSH    Follow up plan: Return in about 1 year (around 10/17/2018) for complete physical.  An After Visit Summary was printed and given to the patient.

## 2017-10-16 NOTE — Assessment & Plan Note (Signed)
USPSTF grade A and B recommendations reviewed with patient; age-appropriate recommendations, preventive care, screening tests, etc discussed and encouraged; healthy living encouraged; see AVS for patient education given to patient  

## 2017-10-16 NOTE — Patient Instructions (Addendum)
Consider getting the new shingles vaccine called Shingrix; that is available for individuals 54 years of age and older, and is recommended even if you have had shingles in the past and/or already received the old shingles vaccine (Zostavax); it is a two-part series, and is available at many local pharmacies  I'll recommend 800 to 1000 iu of vitamin D3 once a day if you not outside for more than 20 minutes  We'll get labs today If you have not heard anything from my staff in a week about any orders/referrals/studies from today, please contact us here to follow-up [phone]  Health Maintenance, Female Adopting a healthy lifestyle and getting preventive care can go a long way to promote health and wellness. Talk with your health care provider about what schedule of regular examinations is right for you. This is a good chance for you to check in with your provider about disease prevention and staying healthy. In between checkups, there are plenty of things you can do on your own. Experts have done a lot of research about which lifestyle changes and preventive measures are most likely to keep you healthy. Ask your health care provider for more information. Weight and diet Eat a healthy diet  Be sure to include plenty of vegetables, fruits, low-fat dairy products, and lean protein.  Do not eat a lot of foods high in solid fats, added sugars, or salt.  Get regular exercise. This is one of the most important things you can do for your health. ? Most adults should exercise for at least 150 minutes each week. The exercise should increase your heart rate and make you sweat (moderate-intensity exercise). ? Most adults should also do strengthening exercises at least twice a week. This is in addition to the moderate-intensity exercise.  Maintain a healthy weight  Body mass index (BMI) is a measurement that can be used to identify possible weight problems. It estimates body fat based on height and  weight. Your health care provider can help determine your BMI and help you achieve or maintain a healthy weight.  For females 68 years of age and older: ? A BMI below 18.5 is considered underweight. ? A BMI of 18.5 to 24.9 is normal. ? A BMI of 25 to 29.9 is considered overweight. ? A BMI of 30 and above is considered obese.  Watch levels of cholesterol and blood lipids  You should start having your blood tested for lipids and cholesterol at 54 years of age, then have this test every 5 years.  You may need to have your cholesterol levels checked more often if: ? Your lipid or cholesterol levels are high. ? You are older than 54 years of age. ? You are at high risk for heart disease.  Cancer screening Lung Cancer  Lung cancer screening is recommended for adults 37-54 years old who are at high risk for lung cancer because of a history of smoking.  A yearly low-dose CT scan of the lungs is recommended for people who: ? Currently smoke. ? Have quit within the past 15 years. ? Have at least a 30-pack-year history of smoking. A pack year is smoking an average of one pack of cigarettes a day for 1 year.  Yearly screening should continue until it has been 15 years since you quit.  Yearly screening should stop if you develop a health problem that would prevent you from having lung cancer treatment.  Breast Cancer  Practice breast self-awareness. This means understanding how your breasts  normally appear and feel.  It also means doing regular breast self-exams. Let your health care provider know about any changes, no matter how small.  If you are in your 20s or 30s, you should have a clinical breast exam (CBE) by a health care provider every 1-3 years as part of a regular health exam.  If you are 92 or older, have a CBE every year. Also consider having a breast X-ray (mammogram) every year.  If you have a family history of breast cancer, talk to your health care provider about genetic  screening.  If you are at high risk for breast cancer, talk to your health care provider about having an MRI and a mammogram every year.  Breast cancer gene (BRCA) assessment is recommended for women who have family members with BRCA-related cancers. BRCA-related cancers include: ? Breast. ? Ovarian. ? Tubal. ? Peritoneal cancers.  Results of the assessment will determine the need for genetic counseling and BRCA1 and BRCA2 testing.  Cervical Cancer Your health care provider may recommend that you be screened regularly for cancer of the pelvic organs (ovaries, uterus, and vagina). This screening involves a pelvic examination, including checking for microscopic changes to the surface of your cervix (Pap test). You may be encouraged to have this screening done every 3 years, beginning at age 67.  For women ages 83-65, health care providers may recommend pelvic exams and Pap testing every 3 years, or they may recommend the Pap and pelvic exam, combined with testing for human papilloma virus (HPV), every 5 years. Some types of HPV increase your risk of cervical cancer. Testing for HPV may also be done on women of any age with unclear Pap test results.  Other health care providers may not recommend any screening for nonpregnant women who are considered low risk for pelvic cancer and who do not have symptoms. Ask your health care provider if a screening pelvic exam is right for you.  If you have had past treatment for cervical cancer or a condition that could lead to cancer, you need Pap tests and screening for cancer for at least 20 years after your treatment. If Pap tests have been discontinued, your risk factors (such as having a new sexual partner) need to be reassessed to determine if screening should resume. Some women have medical problems that increase the chance of getting cervical cancer. In these cases, your health care provider may recommend more frequent screening and Pap  tests.  Colorectal Cancer  This type of cancer can be detected and often prevented.  Routine colorectal cancer screening usually begins at 54 years of age and continues through 54 years of age.  Your health care provider may recommend screening at an earlier age if you have risk factors for colon cancer.  Your health care provider may also recommend using home test kits to check for hidden blood in the stool.  A small camera at the end of a tube can be used to examine your colon directly (sigmoidoscopy or colonoscopy). This is done to check for the earliest forms of colorectal cancer.  Routine screening usually begins at age 1.  Direct examination of the colon should be repeated every 5-10 years through 54 years of age. However, you may need to be screened more often if early forms of precancerous polyps or small growths are found.  Skin Cancer  Check your skin from head to toe regularly.  Tell your health care provider about any new moles or changes in moles,  especially if there is a change in a mole's shape or color.  Also tell your health care provider if you have a mole that is larger than the size of a pencil eraser.  Always use sunscreen. Apply sunscreen liberally and repeatedly throughout the day.  Protect yourself by wearing long sleeves, pants, a wide-brimmed hat, and sunglasses whenever you are outside.  Heart disease, diabetes, and high blood pressure  High blood pressure causes heart disease and increases the risk of stroke. High blood pressure is more likely to develop in: ? People who have blood pressure in the high end of the normal range (130-139/85-89 mm Hg). ? People who are overweight or obese. ? People who are African American.  If you are 53-16 years of age, have your blood pressure checked every 3-5 years. If you are 29 years of age or older, have your blood pressure checked every year. You should have your blood pressure measured twice-once when you are at  a hospital or clinic, and once when you are not at a hospital or clinic. Record the average of the two measurements. To check your blood pressure when you are not at a hospital or clinic, you can use: ? An automated blood pressure machine at a pharmacy. ? A home blood pressure monitor.  If you are between 45 years and 34 years old, ask your health care provider if you should take aspirin to prevent strokes.  Have regular diabetes screenings. This involves taking a blood sample to check your fasting blood sugar level. ? If you are at a normal weight and have a low risk for diabetes, have this test once every three years after 54 years of age. ? If you are overweight and have a high risk for diabetes, consider being tested at a younger age or more often. Preventing infection Hepatitis B  If you have a higher risk for hepatitis B, you should be screened for this virus. You are considered at high risk for hepatitis B if: ? You were born in a country where hepatitis B is common. Ask your health care provider which countries are considered high risk. ? Your parents were born in a high-risk country, and you have not been immunized against hepatitis B (hepatitis B vaccine). ? You have HIV or AIDS. ? You use needles to inject street drugs. ? You live with someone who has hepatitis B. ? You have had sex with someone who has hepatitis B. ? You get hemodialysis treatment. ? You take certain medicines for conditions, including cancer, organ transplantation, and autoimmune conditions.  Hepatitis C  Blood testing is recommended for: ? Everyone born from [birth year] through 1967. ? Anyone with known risk factors for hepatitis C.  Sexually transmitted infections (STIs)  You should be screened for sexually transmitted infections (STIs) including gonorrhea and chlamydia if: ? You are sexually active and are younger than 54 years of age. ? You are older than 54 years of age and your health care provider tells  you that you are at risk for this type of infection. ? Your sexual activity has changed since you were last screened and you are at an increased risk for chlamydia or gonorrhea. Ask your health care provider if you are at risk.  If you do not have HIV, but are at risk, it may be recommended that you take a prescription medicine daily to prevent HIV infection. This is called pre-exposure prophylaxis (PrEP). You are considered at risk if: ? You are sexually  active and do not regularly use condoms or know the HIV status of your partner(s). ? You take drugs by injection. ? You are sexually active with a partner who has HIV.  Talk with your health care provider about whether you are at high risk of being infected with HIV. If you choose to begin PrEP, you should first be tested for HIV. You should then be tested every 3 months for as long as you are taking PrEP. Pregnancy  If you are premenopausal and you may become pregnant, ask your health care provider about preconception counseling.  If you may become pregnant, take 400 to 800 micrograms (mcg) of folic acid every day.  If you want to prevent pregnancy, talk to your health care provider about birth control (contraception). Osteoporosis and menopause  Osteoporosis is a disease in which the bones lose minerals and strength with aging. This can result in serious bone fractures. Your risk for osteoporosis can be identified using a bone density scan.  If you are 72 years of age or older, or if you are at risk for osteoporosis and fractures, ask your health care provider if you should be screened.  Ask your health care provider whether you should take a calcium or vitamin D supplement to lower your risk for osteoporosis.  Menopause may have certain physical symptoms and risks.  Hormone replacement therapy may reduce some of these symptoms and risks. Talk to your health care provider about whether hormone replacement therapy is right for  you. Follow these instructions at home:  Schedule regular health, dental, and eye exams.  Stay current with your immunizations.  Do not use any tobacco products including cigarettes, chewing tobacco, or electronic cigarettes.  If you are pregnant, do not drink alcohol.  If you are breastfeeding, limit how much and how often you drink alcohol.  Limit alcohol intake to no more than 1 drink per day for nonpregnant women. One drink equals 12 ounces of beer, 5 ounces of wine, or 1 ounces of hard liquor.  Do not use street drugs.  Do not share needles.  Ask your health care provider for help if you need support or information about quitting drugs.  Tell your health care provider if you often feel depressed.  Tell your health care provider if you have ever been abused or do not feel safe at home. This information is not intended to replace advice given to you by your health care provider. Make sure you discuss any questions you have with your health care provider. Document Released: 11/07/2010 Document Revised: 09/30/2015 Document Reviewed: 01/26/2015 Elsevier Interactive Patient Education  [employer].

## 2017-10-17 ENCOUNTER — Telehealth: Payer: Self-pay

## 2017-10-17 DIAGNOSIS — E785 Hyperlipidemia, unspecified: Secondary | ICD-10-CM

## 2017-10-17 NOTE — Telephone Encounter (Signed)
-----   Message from Arnetha Courser, MD sent at 10/16/2017  7:45 PM EDT ----- Christy Casey, please let the patient know that her CBC is normal; her CMP is normal; her cholesterol is quite high; I'll recommend medicine to help bring this down; we want her LDL down under 130 at least and under 100 would be even better; her LDL is at 166 right now; if she agrees to try medicine, back to me for Rx and we'll recheck labs in 6 weeks; if she wants to try diet on her own, then recheck labs in 12 weeks (please ORDER labs for whichever route she chooses); thank you

## 2017-11-29 ENCOUNTER — Telehealth: Payer: Self-pay | Admitting: Gastroenterology

## 2017-11-29 NOTE — Telephone Encounter (Signed)
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

## 2018-04-12 ENCOUNTER — Ambulatory Visit
Admission: RE | Admit: 2018-04-12 | Discharge: 2018-04-12 | Disposition: A | Discharge status: Home / Self Care | Payer: 59 | Source: Ambulatory Visit | Attending: Family Medicine | Admitting: Family Medicine

## 2018-04-12 DIAGNOSIS — Z1239 Encounter for other screening for malignant neoplasm of breast: Secondary | ICD-10-CM | POA: Insufficient documentation

## 2018-04-17 ENCOUNTER — Other Ambulatory Visit: Payer: Self-pay | Admitting: Family Medicine

## 2018-04-17 DIAGNOSIS — N6489 Other specified disorders of breast: Secondary | ICD-10-CM

## 2018-04-17 DIAGNOSIS — R928 Other abnormal and inconclusive findings on diagnostic imaging of breast: Secondary | ICD-10-CM

## 2018-04-26 ENCOUNTER — Ambulatory Visit
Admission: RE | Admit: 2018-04-26 | Discharge: 2018-04-26 | Disposition: A | Discharge status: Home / Self Care | Payer: 59 | Source: Ambulatory Visit | Attending: Family Medicine | Admitting: Family Medicine

## 2018-04-26 DIAGNOSIS — R928 Other abnormal and inconclusive findings on diagnostic imaging of breast: Secondary | ICD-10-CM

## 2018-04-26 DIAGNOSIS — N6489 Other specified disorders of breast: Secondary | ICD-10-CM

## 2019-08-23 ENCOUNTER — Ambulatory Visit: Payer: 59 | Attending: Internal Medicine

## 2019-08-23 DIAGNOSIS — Z23 Encounter for immunization: Secondary | ICD-10-CM

## 2019-08-23 NOTE — Progress Notes (Signed)
   Covid-19 Vaccination Clinic  Name:  Christy Casey    MRN: [id number] DOB: 10/10/1965  08/23/2019  [name] was observed post Covid-19 immunization for 15 minutes without incident. She was provided with Vaccine Information Sheet and instruction to access the V-Safe system.   [name] was instructed to call 911 with any severe reactions post vaccine: Marland Kitchen Difficulty breathing  . Swelling of face and throat  . A fast heartbeat  . A bad rash all over body  . Dizziness and weakness   Immunizations Administered    Name Date Dose VIS Date Route   Pfizer COVID-19 Vaccine 08/23/2019 10:29 AM 0.3 mL 04/18/2019 Intramuscular   Manufacturer: Delta   Lot: [id number]   [place]: [id number]

## 2019-09-15 ENCOUNTER — Ambulatory Visit: Payer: Self-pay | Attending: Internal Medicine

## 2019-09-15 DIAGNOSIS — Z23 Encounter for immunization: Secondary | ICD-10-CM

## 2019-09-15 NOTE — Progress Notes (Signed)
    Covid-19 Vaccination Clinic  Name:  Chiqueta Lotti    MRN: [id number] DOB: [birth year]  09/15/2019  Ms. Petruzzi was observed post Covid-19 immunization for 15 minutes without incident. She was provided with Vaccine Information Sheet and instruction to access the V-Safe system.   Ms. Kellough was instructed to call 911 with any severe reactions post vaccine: Marland Kitchen Difficulty breathing  . Swelling of face and throat  . A fast heartbeat  . A bad rash all over body  . Dizziness and weakness

## 2020-04-17 IMAGING — MG DIGITAL SCREENING BILATERAL MAMMOGRAM WITH TOMO AND CAD
8 series · 9 of 24 positions shown · non-contrast
Comparison: Previous exam(s).

CLINICAL DATA: Screening.

EXAM:
DIGITAL SCREENING BILATERAL MAMMOGRAM WITH TOMO AND CAD

[R MLO synth-2D]
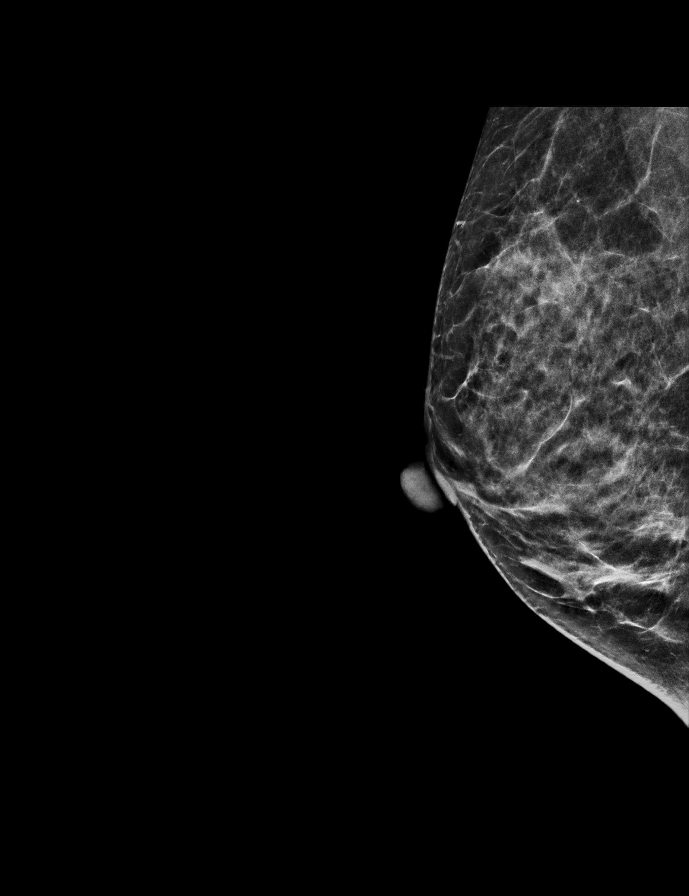

[L CC synth-2D]
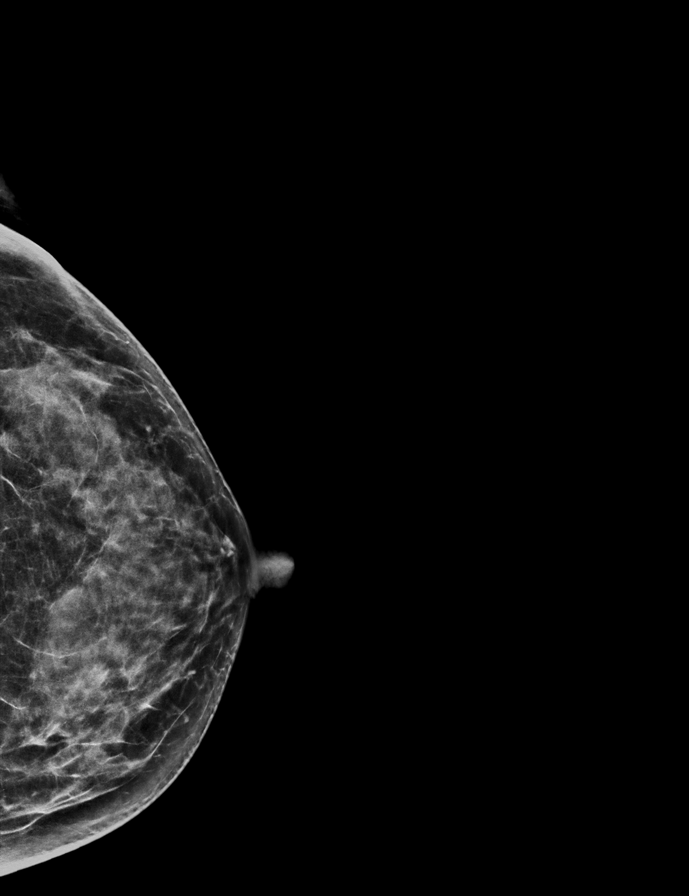

[L MLO synth-2D]
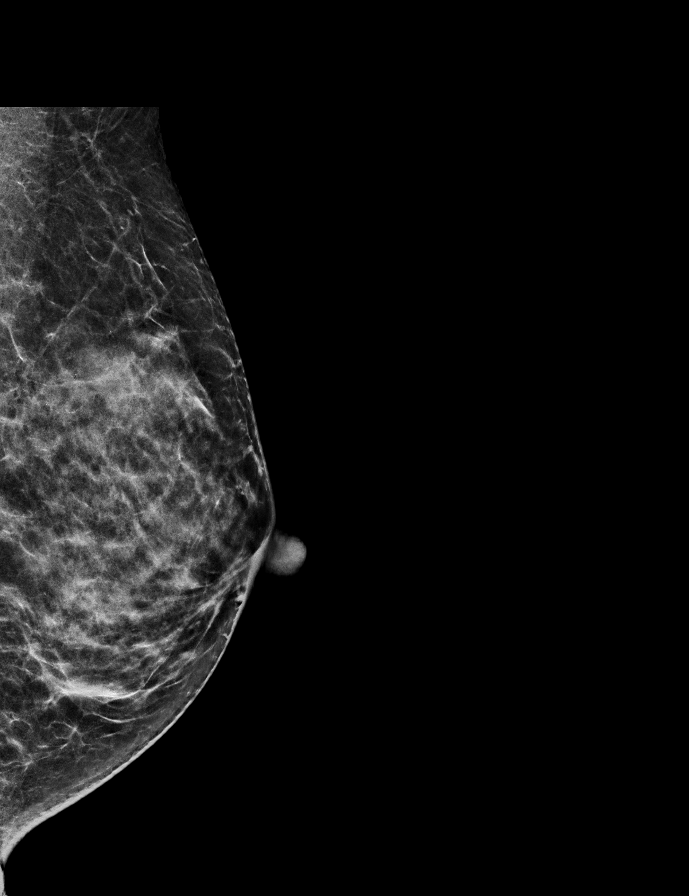

[R CC synth-2D]
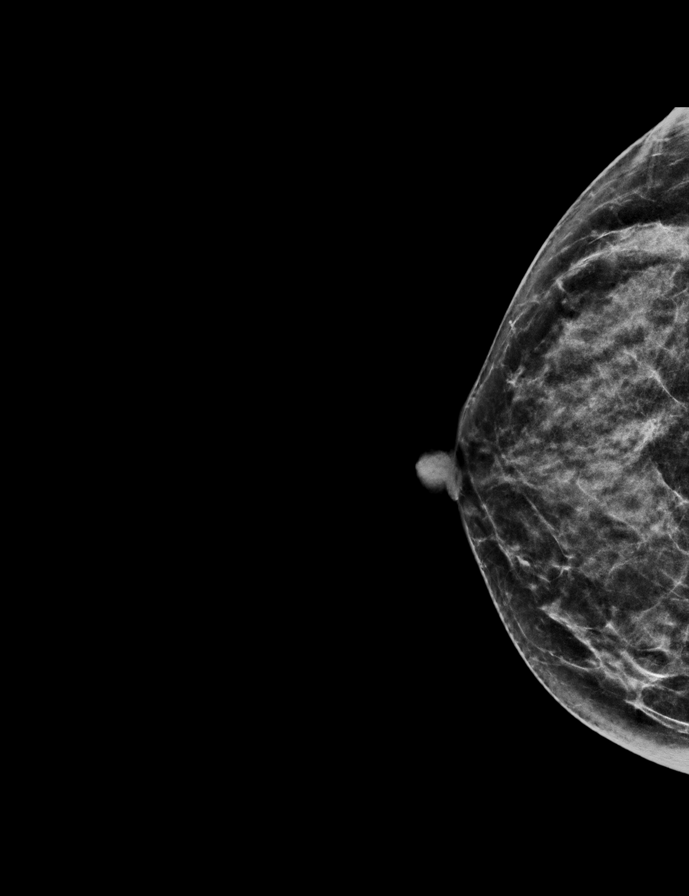

[L MLO tomo · 2 of 52 frames shown]
[frame 17/52]
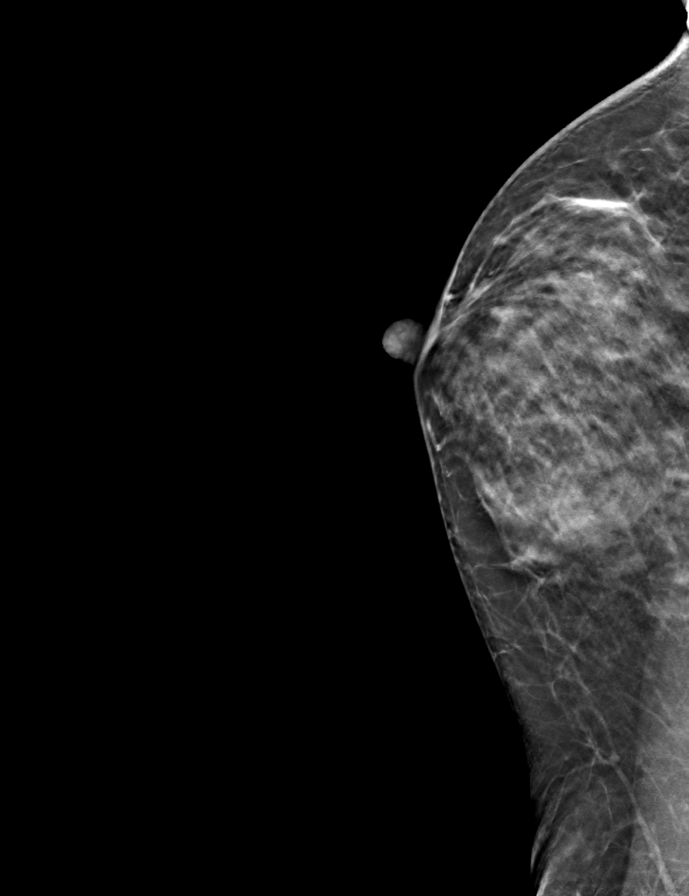
[frame 27/52]
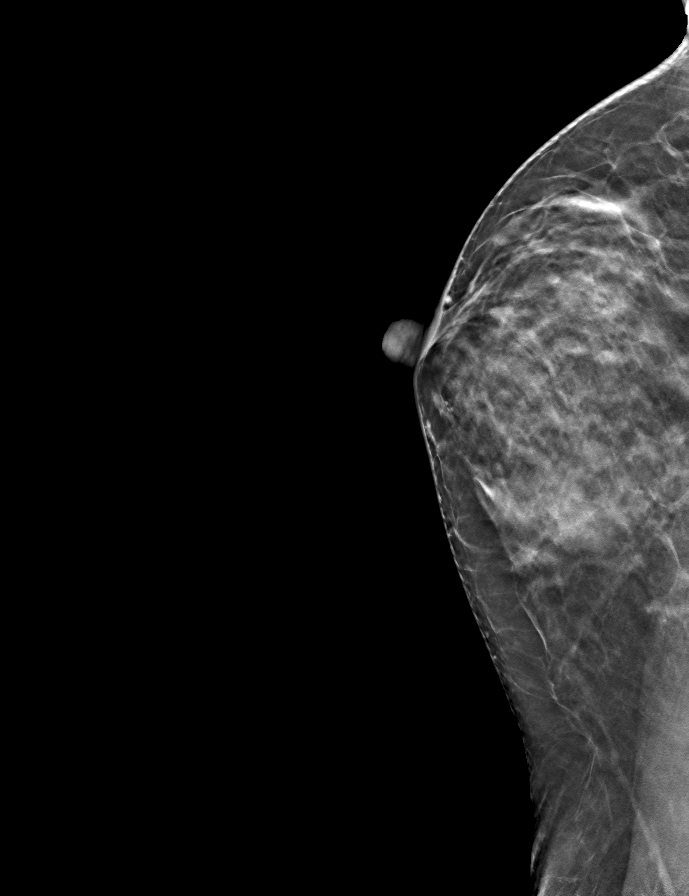

[R MLO tomo · tomo slice 27/52.0]
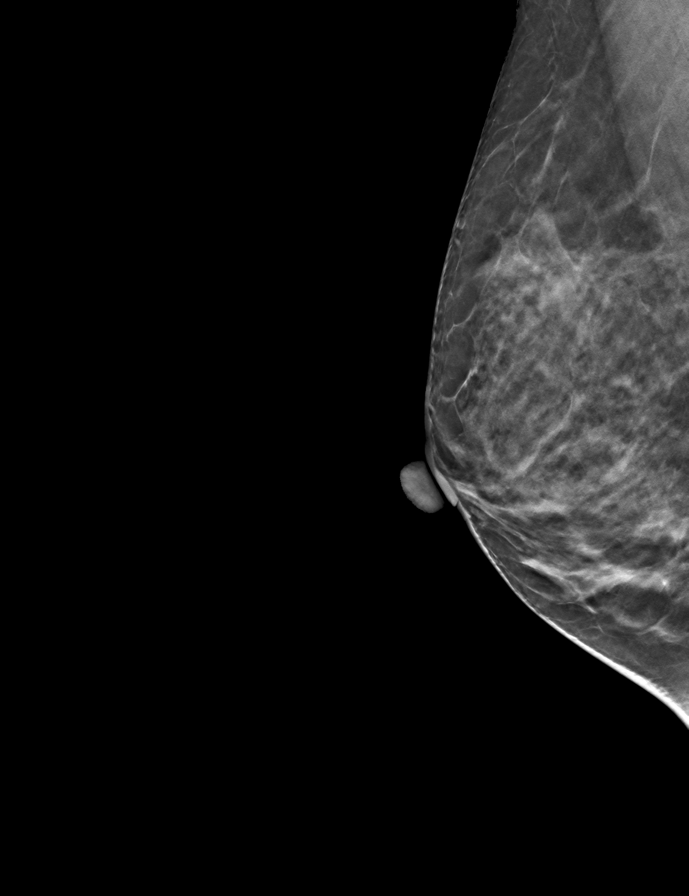

[R CC tomo · tomo slice 28/55.0]
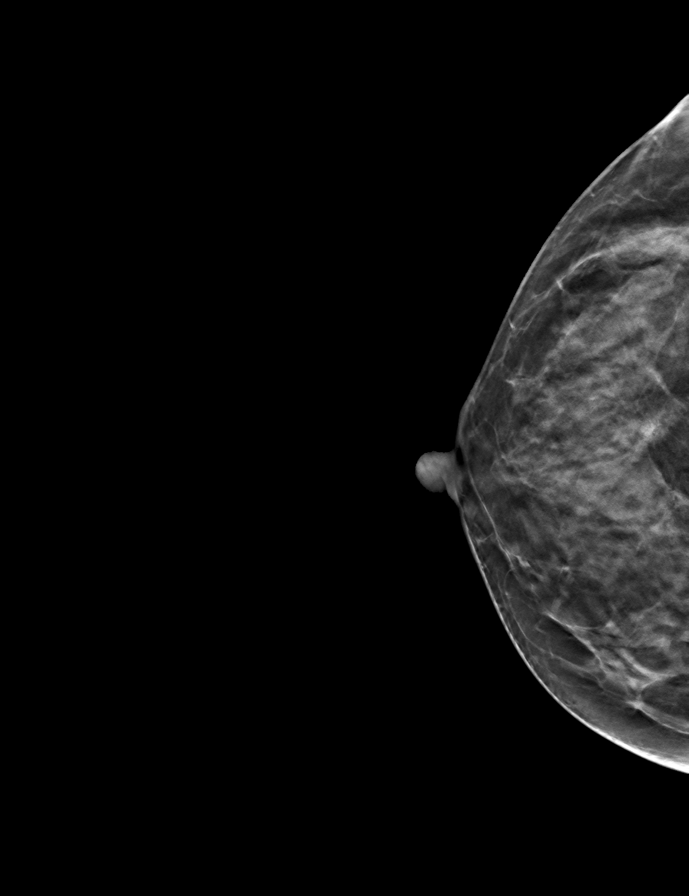

[L CC tomo · tomo slice 31/60.0]
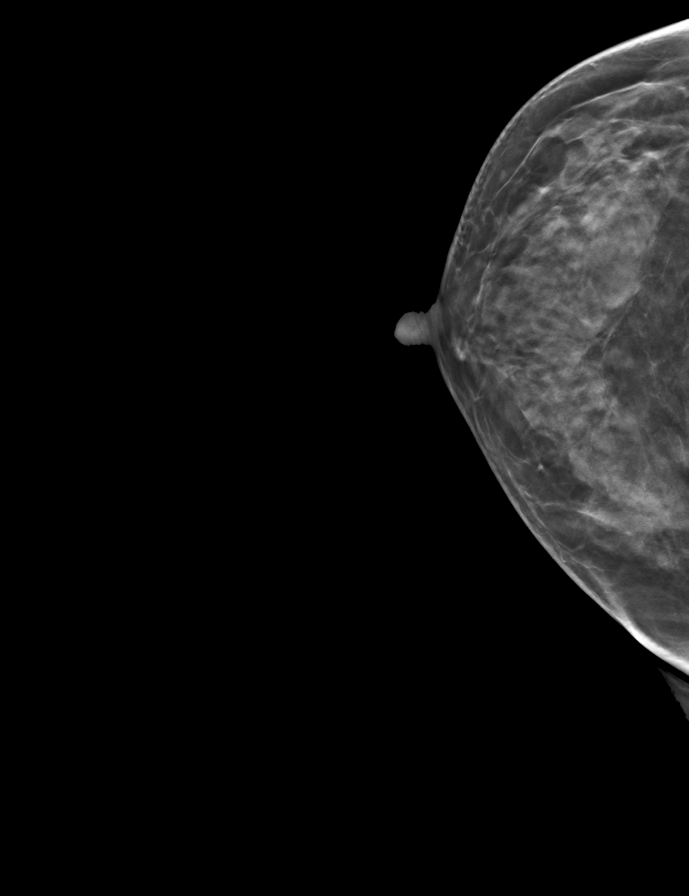

[9 of 24 positions shown; findings below may reference images not displayed]

ACR Breast Density Category d: The breast tissue is extremely dense,
which lowers the sensitivity of mammography.
FINDINGS: In the right breast, a possible asymmetry warrants further
evaluation. In the left breast, no findings suspicious for
malignancy. Images were processed with CAD.
IMPRESSION: Further evaluation is suggested for possible asymmetry in the right
breast.

RECOMMENDATION:
Diagnostic mammogram and possibly ultrasound of the right breast.
(Code:[id number])

The patient will be contacted regarding the findings, and additional
imaging will be scheduled.

BI-RADS CATEGORY  0: Incomplete. Need additional imaging evaluation
and/or prior mammograms for comparison.

## 2022-02-15 ENCOUNTER — Other Ambulatory Visit: Payer: Self-pay | Admitting: Internal Medicine

## 2022-02-15 DIAGNOSIS — Z1231 Encounter for screening mammogram for malignant neoplasm of breast: Secondary | ICD-10-CM

## 2022-08-10 ENCOUNTER — Ambulatory Visit
Admission: RE | Admit: 2022-08-10 | Discharge: 2022-08-10 | Disposition: A | Discharge status: Home / Self Care | Payer: No Typology Code available for payment source | Source: Ambulatory Visit | Attending: Internal Medicine | Admitting: Internal Medicine

## 2022-08-10 DIAGNOSIS — Z1231 Encounter for screening mammogram for malignant neoplasm of breast: Secondary | ICD-10-CM | POA: Diagnosis present

## 2024-02-19 ENCOUNTER — Other Ambulatory Visit: Payer: Self-pay | Admitting: Internal Medicine

## 2024-02-19 DIAGNOSIS — Z1231 Encounter for screening mammogram for malignant neoplasm of breast: Secondary | ICD-10-CM

## 2024-03-21 ENCOUNTER — Ambulatory Visit
Admission: RE | Admit: 2024-03-21 | Discharge: 2024-03-21 | Disposition: A | Discharge status: Home / Self Care | Source: Ambulatory Visit | Attending: Internal Medicine | Admitting: Internal Medicine

## 2024-03-21 DIAGNOSIS — Z1231 Encounter for screening mammogram for malignant neoplasm of breast: Secondary | ICD-10-CM | POA: Insufficient documentation
